# Patient Record
Sex: Female | Born: 1950
Health system: Southern US, Community
[De-identification: ages and names within clinical notes are randomized; demographics above are authoritative.]

## PROBLEM LIST (undated history)

## (undated) DIAGNOSIS — I10 Essential (primary) hypertension: Secondary | ICD-10-CM

## (undated) DIAGNOSIS — E7800 Pure hypercholesterolemia, unspecified: Secondary | ICD-10-CM

## (undated) DIAGNOSIS — E079 Disorder of thyroid, unspecified: Secondary | ICD-10-CM

---

## 2013-05-07 ENCOUNTER — Other Ambulatory Visit: Payer: Self-pay | Admitting: Family Medicine

## 2013-05-07 DIAGNOSIS — Z1231 Encounter for screening mammogram for malignant neoplasm of breast: Secondary | ICD-10-CM

## 2013-05-07 DIAGNOSIS — E2839 Other primary ovarian failure: Secondary | ICD-10-CM

## 2013-08-12 ENCOUNTER — Ambulatory Visit
Admission: RE | Admit: 2013-08-12 | Discharge: 2013-08-12 | Disposition: A | Discharge status: Home / Self Care | Payer: Medicare Other | Source: Ambulatory Visit | Attending: Family Medicine | Admitting: Family Medicine

## 2013-08-12 DIAGNOSIS — Z1231 Encounter for screening mammogram for malignant neoplasm of breast: Secondary | ICD-10-CM

## 2013-08-12 DIAGNOSIS — E2839 Other primary ovarian failure: Secondary | ICD-10-CM

## 2013-09-21 ENCOUNTER — Encounter (HOSPITAL_COMMUNITY): Payer: Self-pay | Admitting: Emergency Medicine

## 2013-09-21 ENCOUNTER — Emergency Department (HOSPITAL_COMMUNITY)
Admission: EM | Admit: 2013-09-21 | Discharge: 2013-09-21 | Disposition: A | Discharge status: Home / Self Care | Payer: Medicare Other | Attending: Emergency Medicine | Admitting: Emergency Medicine

## 2013-09-21 DIAGNOSIS — Z77098 Contact with and (suspected) exposure to other hazardous, chiefly nonmedicinal, chemicals: Secondary | ICD-10-CM

## 2013-09-21 DIAGNOSIS — H0289 Other specified disorders of eyelid: Secondary | ICD-10-CM | POA: Insufficient documentation

## 2013-09-21 DIAGNOSIS — Z7729 Contact with and (suspected ) exposure to other hazardous substances: Secondary | ICD-10-CM | POA: Insufficient documentation

## 2013-09-21 DIAGNOSIS — E7800 Pure hypercholesterolemia, unspecified: Secondary | ICD-10-CM | POA: Insufficient documentation

## 2013-09-21 DIAGNOSIS — L988 Other specified disorders of the skin and subcutaneous tissue: Secondary | ICD-10-CM | POA: Insufficient documentation

## 2013-09-21 DIAGNOSIS — E079 Disorder of thyroid, unspecified: Secondary | ICD-10-CM | POA: Insufficient documentation

## 2013-09-21 DIAGNOSIS — I10 Essential (primary) hypertension: Secondary | ICD-10-CM | POA: Insufficient documentation

## 2013-09-21 DIAGNOSIS — Z79899 Other long term (current) drug therapy: Secondary | ICD-10-CM | POA: Insufficient documentation

## 2013-09-21 HISTORY — DX: Pure hypercholesterolemia, unspecified: E78.00

## 2013-09-21 HISTORY — DX: Disorder of thyroid, unspecified: E07.9

## 2013-09-21 HISTORY — DX: Essential (primary) hypertension: I10

## 2013-09-21 MED ORDER — TETRACAINE HCL 0.5 % OP SOLN
2.0000 [drp] | Freq: Once | OPHTHALMIC | Status: AC
Start: 2013-09-21 — End: 2013-09-21
  Administered 2013-09-21: 2 [drp] via OPHTHALMIC
  Filled 2013-09-21: qty 2

## 2013-09-21 MED ORDER — FLUORESCEIN SODIUM 1 MG OP STRP
1.0000 | ORAL_STRIP | Freq: Once | OPHTHALMIC | Status: AC
  Administered 2013-09-21: 1 via OPHTHALMIC
  Filled 2013-09-21: qty 1

## 2013-09-21 NOTE — ED Notes (Signed)
Hair dye bottle exploded, splashing into left eye. C/o continued burning. Charge nurse notified of need for eye wash station.

## 2013-09-21 NOTE — ED Provider Notes (Signed)
CSN: [id number]     Arrival date & time 09/21/13  1152 History   First MD Initiated Contact with Patient 09/21/13 1226     Chief Complaint  Patient presents with  . Eye Injury  . Chemical Exposure     (Consider location/radiation/quality/duration/timing/severity/associated sxs/prior Treatment) HPI Comments: Patient presents today with a chief complaint of skin irritation.  She reports that just prior to arrival a bottle with hair dye broke as she was putting the dye in her hair.  The hair dye then got on her face.  She is currently complaining of irritation to the left upper eyelid and left side of her face.  She denies ingesting any of the dye.  She denies any eye pain or vision changes.  Denies any redness or discharge of the eye.  She reports that she washed out her face and eyes with water after the exposure.  She denies any shortness of breath.    The history is provided by the patient.    Past Medical History  Diagnosis Date  . Hypertension   . Thyroid disease   . High cholesterol    History reviewed. No pertinent past surgical history. History reviewed. No pertinent family history. History  Substance Use Topics  . Smoking status: Not on file  . Smokeless tobacco: Not on file  . Alcohol Use: No   OB History   Grav Para Term Preterm Abortions TAB SAB Ect Mult Living                 Review of Systems  All other systems reviewed and are negative.     Allergies  Aspirin  Home Medications   Prior to Admission medications   Medication Sig Start Date End Date Taking? Authorizing Provider  levothyroxine (SYNTHROID, LEVOTHROID) 112 MCG tablet Take 112 mcg by mouth daily before breakfast.   Yes Historical Provider, MD  simvastatin (ZOCOR) 5 MG tablet Take 5 mg by mouth daily.   Yes Historical Provider, MD   BP 148/96  Pulse 65  Temp(Src) 99.1 F (37.3 C) (Oral)  Resp 20  SpO2 99% Physical Exam  Nursing note and vitals reviewed. Constitutional: She appears  well-developed and well-nourished.  HENT:  Head: Normocephalic and atraumatic.  Eyes: EOM are normal. Pupils are equal, round, and reactive to light. Right conjunctiva is not injected. Left conjunctiva is not injected.  Slit lamp exam:      The left eye shows no corneal abrasion, no corneal ulcer, no foreign body and no fluorescein uptake.  Visual acuity 20/70 bilaterally  Neck: Normal range of motion. Neck supple.  Cardiovascular: Normal rate, regular rhythm and normal heart sounds.   Pulmonary/Chest: Effort normal and breath sounds normal.  Neurological: She is alert.  Skin: Skin is warm and dry.  No obvious erythema, blistering, or irritation of the skin  Psychiatric: She has a normal mood and affect.    ED Course  Procedures (including critical care time) Labs Review Labs Reviewed - No data to display  Imaging Review No results found.   EKG Interpretation None      MDM   Final diagnoses:  None   Patient presenting with skin irritation after spilling hair dye on her face.  No ingestion of the dye.  No changes in vision.  Eyes are not injected.  No signs of skin injury on exam.  Fluorescein stain is negative.   Patient stable for discharge.    Hyman Bible, PA-C 09/21/13 1547

## 2013-09-21 NOTE — ED Notes (Signed)
Onset 11am pt was sqeezing bottle of hair dye cracked and hair dye got on face.  Burning to left upper eyelid.  Eyes not burning, no visual disturbances.  No other s/s noted.

## 2013-09-21 NOTE — ED Notes (Signed)
Pt reports having a bottle of hair dye explode while in her hand, and it went in her face and eyes. Pt reports rinsing her eyes out pta, having burning pain to left eye, denies any vision loss.

## 2013-09-21 NOTE — Discharge Instructions (Signed)
Flush eyes with water.  Wash off skin with cool water.  May apply ice to the skin if it is burning.  Return to the ER if you develop changes in your vision.

## 2013-09-21 NOTE — ED Notes (Signed)
The patient does wear no line bifocals. The tech has reported to the RN in charge.

## 2013-09-22 NOTE — ED Provider Notes (Signed)
Medical screening examination/treatment/procedure(s) were performed by non-physician practitioner and as supervising physician I was immediately available for consultation/collaboration.   Dot Lanes, MD 09/22/13 [phone]

## 2014-01-21 ENCOUNTER — Other Ambulatory Visit: Payer: Self-pay | Admitting: Family Medicine

## 2014-01-24 ENCOUNTER — Other Ambulatory Visit: Payer: Self-pay | Admitting: Family Medicine

## 2014-01-24 DIAGNOSIS — Z720 Tobacco use: Secondary | ICD-10-CM

## 2014-02-07 ENCOUNTER — Other Ambulatory Visit: Payer: Self-pay | Admitting: Family Medicine

## 2014-02-07 ENCOUNTER — Ambulatory Visit
Admission: RE | Admit: 2014-02-07 | Discharge: 2014-02-07 | Disposition: A | Discharge status: Home / Self Care | Payer: Medicare Other | Source: Ambulatory Visit | Attending: Family Medicine | Admitting: Family Medicine

## 2014-02-07 DIAGNOSIS — Z720 Tobacco use: Secondary | ICD-10-CM

## 2014-09-22 ENCOUNTER — Other Ambulatory Visit: Payer: Self-pay

## 2014-09-22 DIAGNOSIS — Z1231 Encounter for screening mammogram for malignant neoplasm of breast: Secondary | ICD-10-CM

## 2014-10-01 ENCOUNTER — Ambulatory Visit
Admission: RE | Admit: 2014-10-01 | Discharge: 2014-10-01 | Disposition: A | Discharge status: Home / Self Care | Payer: Medicare Other | Source: Ambulatory Visit

## 2014-10-01 DIAGNOSIS — Z1231 Encounter for screening mammogram for malignant neoplasm of breast: Secondary | ICD-10-CM

## 2015-01-09 ENCOUNTER — Other Ambulatory Visit: Payer: Self-pay | Admitting: Family Medicine

## 2015-01-09 ENCOUNTER — Other Ambulatory Visit (HOSPITAL_COMMUNITY)
Admission: RE | Admit: 2015-01-09 | Discharge: 2015-01-09 | Disposition: A | Discharge status: Home / Self Care | Payer: Medicare Other | Source: Ambulatory Visit | Attending: Family Medicine | Admitting: Family Medicine

## 2015-01-09 DIAGNOSIS — Z124 Encounter for screening for malignant neoplasm of cervix: Secondary | ICD-10-CM | POA: Insufficient documentation

## 2015-01-09 DIAGNOSIS — Z87898 Personal history of other specified conditions: Secondary | ICD-10-CM | POA: Diagnosis present

## 2015-01-13 LAB — CYTOLOGY - PAP

## 2015-02-11 ENCOUNTER — Ambulatory Visit: Payer: Self-pay | Admitting: Gynecology

## 2015-03-09 ENCOUNTER — Ambulatory Visit: Payer: Medicare Other | Admitting: Gynecology

## 2015-06-09 DIAGNOSIS — G5600 Carpal tunnel syndrome, unspecified upper limb: Secondary | ICD-10-CM | POA: Diagnosis not present

## 2015-06-09 DIAGNOSIS — E1169 Type 2 diabetes mellitus with other specified complication: Secondary | ICD-10-CM | POA: Diagnosis not present

## 2015-06-09 DIAGNOSIS — E119 Type 2 diabetes mellitus without complications: Secondary | ICD-10-CM | POA: Diagnosis not present

## 2015-06-09 DIAGNOSIS — Z79899 Other long term (current) drug therapy: Secondary | ICD-10-CM | POA: Diagnosis not present

## 2015-06-09 DIAGNOSIS — I10 Essential (primary) hypertension: Secondary | ICD-10-CM | POA: Diagnosis not present

## 2015-06-09 DIAGNOSIS — Z7984 Long term (current) use of oral hypoglycemic drugs: Secondary | ICD-10-CM | POA: Diagnosis not present

## 2015-06-09 DIAGNOSIS — E039 Hypothyroidism, unspecified: Secondary | ICD-10-CM | POA: Diagnosis not present

## 2015-06-10 DIAGNOSIS — H18413 Arcus senilis, bilateral: Secondary | ICD-10-CM | POA: Diagnosis not present

## 2015-06-10 DIAGNOSIS — H04123 Dry eye syndrome of bilateral lacrimal glands: Secondary | ICD-10-CM | POA: Diagnosis not present

## 2015-06-10 DIAGNOSIS — H11153 Pinguecula, bilateral: Secondary | ICD-10-CM | POA: Diagnosis not present

## 2015-06-10 DIAGNOSIS — H04213 Epiphora due to excess lacrimation, bilateral lacrimal glands: Secondary | ICD-10-CM | POA: Diagnosis not present

## 2015-06-10 DIAGNOSIS — Z961 Presence of intraocular lens: Secondary | ICD-10-CM | POA: Diagnosis not present

## 2015-06-10 DIAGNOSIS — S0500XA Injury of conjunctiva and corneal abrasion without foreign body, unspecified eye, initial encounter: Secondary | ICD-10-CM | POA: Diagnosis not present

## 2015-06-10 DIAGNOSIS — Z9849 Cataract extraction status, unspecified eye: Secondary | ICD-10-CM | POA: Diagnosis not present

## 2015-06-15 DIAGNOSIS — E1169 Type 2 diabetes mellitus with other specified complication: Secondary | ICD-10-CM | POA: Diagnosis not present

## 2015-06-15 DIAGNOSIS — I10 Essential (primary) hypertension: Secondary | ICD-10-CM | POA: Diagnosis not present

## 2015-06-15 DIAGNOSIS — E119 Type 2 diabetes mellitus without complications: Secondary | ICD-10-CM | POA: Diagnosis not present

## 2015-06-15 DIAGNOSIS — Z79899 Other long term (current) drug therapy: Secondary | ICD-10-CM | POA: Diagnosis not present

## 2015-06-15 DIAGNOSIS — E039 Hypothyroidism, unspecified: Secondary | ICD-10-CM | POA: Diagnosis not present

## 2015-06-15 DIAGNOSIS — R3129 Other microscopic hematuria: Secondary | ICD-10-CM | POA: Diagnosis not present

## 2015-08-21 ENCOUNTER — Other Ambulatory Visit: Payer: Self-pay

## 2015-08-21 DIAGNOSIS — Z1231 Encounter for screening mammogram for malignant neoplasm of breast: Secondary | ICD-10-CM

## 2015-10-02 ENCOUNTER — Other Ambulatory Visit: Payer: Self-pay | Admitting: Family Medicine

## 2015-10-02 ENCOUNTER — Ambulatory Visit
Admission: RE | Admit: 2015-10-02 | Discharge: 2015-10-02 | Disposition: A | Discharge status: Home / Self Care | Payer: Commercial Managed Care - HMO | Source: Ambulatory Visit

## 2015-10-02 DIAGNOSIS — Z1231 Encounter for screening mammogram for malignant neoplasm of breast: Secondary | ICD-10-CM | POA: Diagnosis not present

## 2015-10-07 ENCOUNTER — Other Ambulatory Visit: Payer: Self-pay | Admitting: Family Medicine

## 2015-10-07 DIAGNOSIS — R928 Other abnormal and inconclusive findings on diagnostic imaging of breast: Secondary | ICD-10-CM

## 2015-10-13 ENCOUNTER — Ambulatory Visit
Admission: RE | Admit: 2015-10-13 | Discharge: 2015-10-13 | Disposition: A | Discharge status: Home / Self Care | Payer: Commercial Managed Care - HMO | Source: Ambulatory Visit | Attending: Family Medicine | Admitting: Family Medicine

## 2015-10-13 DIAGNOSIS — R928 Other abnormal and inconclusive findings on diagnostic imaging of breast: Secondary | ICD-10-CM

## 2015-10-13 DIAGNOSIS — R922 Inconclusive mammogram: Secondary | ICD-10-CM | POA: Diagnosis not present

## 2016-03-29 DIAGNOSIS — I10 Essential (primary) hypertension: Secondary | ICD-10-CM | POA: Diagnosis not present

## 2016-04-26 DIAGNOSIS — H9319 Tinnitus, unspecified ear: Secondary | ICD-10-CM | POA: Diagnosis not present

## 2016-05-09 DIAGNOSIS — Z79899 Other long term (current) drug therapy: Secondary | ICD-10-CM | POA: Diagnosis not present

## 2016-05-09 DIAGNOSIS — Z7984 Long term (current) use of oral hypoglycemic drugs: Secondary | ICD-10-CM | POA: Diagnosis not present

## 2016-05-09 DIAGNOSIS — E785 Hyperlipidemia, unspecified: Secondary | ICD-10-CM | POA: Diagnosis not present

## 2016-05-09 DIAGNOSIS — E119 Type 2 diabetes mellitus without complications: Secondary | ICD-10-CM | POA: Diagnosis not present

## 2016-07-04 DIAGNOSIS — K6389 Other specified diseases of intestine: Secondary | ICD-10-CM | POA: Diagnosis not present

## 2016-07-04 DIAGNOSIS — D126 Benign neoplasm of colon, unspecified: Secondary | ICD-10-CM | POA: Diagnosis not present

## 2016-07-04 DIAGNOSIS — K621 Rectal polyp: Secondary | ICD-10-CM | POA: Diagnosis not present

## 2016-07-04 DIAGNOSIS — Z1211 Encounter for screening for malignant neoplasm of colon: Secondary | ICD-10-CM | POA: Diagnosis not present

## 2016-07-04 DIAGNOSIS — K648 Other hemorrhoids: Secondary | ICD-10-CM | POA: Diagnosis not present

## 2016-07-07 DIAGNOSIS — D126 Benign neoplasm of colon, unspecified: Secondary | ICD-10-CM | POA: Diagnosis not present

## 2016-07-07 DIAGNOSIS — Z1211 Encounter for screening for malignant neoplasm of colon: Secondary | ICD-10-CM | POA: Diagnosis not present

## 2016-07-07 DIAGNOSIS — K621 Rectal polyp: Secondary | ICD-10-CM | POA: Diagnosis not present

## 2016-08-09 DIAGNOSIS — G5600 Carpal tunnel syndrome, unspecified upper limb: Secondary | ICD-10-CM | POA: Diagnosis not present

## 2016-08-09 DIAGNOSIS — Z7984 Long term (current) use of oral hypoglycemic drugs: Secondary | ICD-10-CM | POA: Diagnosis not present

## 2016-08-09 DIAGNOSIS — I10 Essential (primary) hypertension: Secondary | ICD-10-CM | POA: Diagnosis not present

## 2016-08-09 DIAGNOSIS — E7800 Pure hypercholesterolemia, unspecified: Secondary | ICD-10-CM | POA: Diagnosis not present

## 2016-08-09 DIAGNOSIS — E039 Hypothyroidism, unspecified: Secondary | ICD-10-CM | POA: Diagnosis not present

## 2016-08-09 DIAGNOSIS — E119 Type 2 diabetes mellitus without complications: Secondary | ICD-10-CM | POA: Diagnosis not present

## 2016-09-02 ENCOUNTER — Other Ambulatory Visit: Payer: Self-pay | Admitting: Family Medicine

## 2016-09-02 DIAGNOSIS — Z1231 Encounter for screening mammogram for malignant neoplasm of breast: Secondary | ICD-10-CM

## 2016-09-14 ENCOUNTER — Encounter: Payer: Self-pay | Admitting: Gynecology

## 2016-10-17 ENCOUNTER — Ambulatory Visit: Payer: Commercial Managed Care - HMO

## 2016-10-17 DIAGNOSIS — Z7984 Long term (current) use of oral hypoglycemic drugs: Secondary | ICD-10-CM | POA: Diagnosis not present

## 2016-10-17 DIAGNOSIS — Z1389 Encounter for screening for other disorder: Secondary | ICD-10-CM | POA: Diagnosis not present

## 2016-10-17 DIAGNOSIS — E039 Hypothyroidism, unspecified: Secondary | ICD-10-CM | POA: Diagnosis not present

## 2016-10-17 DIAGNOSIS — Z1159 Encounter for screening for other viral diseases: Secondary | ICD-10-CM | POA: Diagnosis not present

## 2016-10-17 DIAGNOSIS — E7800 Pure hypercholesterolemia, unspecified: Secondary | ICD-10-CM | POA: Diagnosis not present

## 2016-10-17 DIAGNOSIS — E119 Type 2 diabetes mellitus without complications: Secondary | ICD-10-CM | POA: Diagnosis not present

## 2016-10-17 DIAGNOSIS — Z0000 Encounter for general adult medical examination without abnormal findings: Secondary | ICD-10-CM | POA: Diagnosis not present

## 2016-10-17 DIAGNOSIS — I10 Essential (primary) hypertension: Secondary | ICD-10-CM | POA: Diagnosis not present

## 2016-10-20 ENCOUNTER — Ambulatory Visit
Admission: RE | Admit: 2016-10-20 | Discharge: 2016-10-20 | Disposition: A | Discharge status: Home / Self Care | Payer: Commercial Managed Care - HMO | Source: Ambulatory Visit | Attending: Physician Assistant | Admitting: Physician Assistant

## 2016-10-20 DIAGNOSIS — Z1231 Encounter for screening mammogram for malignant neoplasm of breast: Secondary | ICD-10-CM | POA: Diagnosis not present

## 2016-10-25 DIAGNOSIS — Z961 Presence of intraocular lens: Secondary | ICD-10-CM | POA: Diagnosis not present

## 2016-10-25 DIAGNOSIS — H52221 Regular astigmatism, right eye: Secondary | ICD-10-CM | POA: Diagnosis not present

## 2016-10-25 DIAGNOSIS — H11423 Conjunctival edema, bilateral: Secondary | ICD-10-CM | POA: Diagnosis not present

## 2016-10-25 DIAGNOSIS — Z9849 Cataract extraction status, unspecified eye: Secondary | ICD-10-CM | POA: Diagnosis not present

## 2016-10-25 DIAGNOSIS — H35363 Drusen (degenerative) of macula, bilateral: Secondary | ICD-10-CM | POA: Diagnosis not present

## 2016-10-25 DIAGNOSIS — H5213 Myopia, bilateral: Secondary | ICD-10-CM | POA: Diagnosis not present

## 2016-10-25 DIAGNOSIS — H18413 Arcus senilis, bilateral: Secondary | ICD-10-CM | POA: Diagnosis not present

## 2016-10-25 DIAGNOSIS — H11153 Pinguecula, bilateral: Secondary | ICD-10-CM | POA: Diagnosis not present

## 2016-10-25 DIAGNOSIS — H353131 Nonexudative age-related macular degeneration, bilateral, early dry stage: Secondary | ICD-10-CM | POA: Diagnosis not present

## 2016-10-31 DIAGNOSIS — J209 Acute bronchitis, unspecified: Secondary | ICD-10-CM | POA: Diagnosis not present

## 2016-12-19 DIAGNOSIS — E039 Hypothyroidism, unspecified: Secondary | ICD-10-CM | POA: Diagnosis not present

## 2017-04-11 DIAGNOSIS — M25461 Effusion, right knee: Secondary | ICD-10-CM | POA: Diagnosis not present

## 2017-04-11 DIAGNOSIS — S8991XA Unspecified injury of right lower leg, initial encounter: Secondary | ICD-10-CM | POA: Diagnosis not present

## 2017-04-14 DIAGNOSIS — W19XXXA Unspecified fall, initial encounter: Secondary | ICD-10-CM | POA: Diagnosis not present

## 2017-04-14 DIAGNOSIS — S8991XA Unspecified injury of right lower leg, initial encounter: Secondary | ICD-10-CM | POA: Diagnosis not present

## 2017-04-18 DIAGNOSIS — E7800 Pure hypercholesterolemia, unspecified: Secondary | ICD-10-CM | POA: Diagnosis not present

## 2017-04-18 DIAGNOSIS — E039 Hypothyroidism, unspecified: Secondary | ICD-10-CM | POA: Diagnosis not present

## 2017-04-18 DIAGNOSIS — I10 Essential (primary) hypertension: Secondary | ICD-10-CM | POA: Diagnosis not present

## 2017-04-18 DIAGNOSIS — Z23 Encounter for immunization: Secondary | ICD-10-CM | POA: Diagnosis not present

## 2017-04-18 DIAGNOSIS — Z7984 Long term (current) use of oral hypoglycemic drugs: Secondary | ICD-10-CM | POA: Diagnosis not present

## 2017-04-18 DIAGNOSIS — E119 Type 2 diabetes mellitus without complications: Secondary | ICD-10-CM | POA: Diagnosis not present

## 2017-04-18 DIAGNOSIS — E1169 Type 2 diabetes mellitus with other specified complication: Secondary | ICD-10-CM | POA: Diagnosis not present

## 2017-09-11 ENCOUNTER — Other Ambulatory Visit: Payer: Self-pay | Admitting: Internal Medicine

## 2017-09-11 DIAGNOSIS — Z1231 Encounter for screening mammogram for malignant neoplasm of breast: Secondary | ICD-10-CM

## 2017-10-14 ENCOUNTER — Other Ambulatory Visit: Payer: Self-pay

## 2017-10-14 ENCOUNTER — Emergency Department (HOSPITAL_COMMUNITY)
Admission: EM | Admit: 2017-10-14 | Discharge: 2017-10-14 | Disposition: A | Discharge status: Home / Self Care | Payer: Medicare Other | Attending: Emergency Medicine | Admitting: Emergency Medicine

## 2017-10-14 ENCOUNTER — Encounter (HOSPITAL_COMMUNITY): Payer: Self-pay | Admitting: Emergency Medicine

## 2017-10-14 DIAGNOSIS — S6991XA Unspecified injury of right wrist, hand and finger(s), initial encounter: Secondary | ICD-10-CM | POA: Diagnosis present

## 2017-10-14 DIAGNOSIS — Y939 Activity, unspecified: Secondary | ICD-10-CM | POA: Diagnosis not present

## 2017-10-14 DIAGNOSIS — W5501XA Bitten by cat, initial encounter: Secondary | ICD-10-CM | POA: Insufficient documentation

## 2017-10-14 DIAGNOSIS — E079 Disorder of thyroid, unspecified: Secondary | ICD-10-CM | POA: Insufficient documentation

## 2017-10-14 DIAGNOSIS — Z79899 Other long term (current) drug therapy: Secondary | ICD-10-CM | POA: Diagnosis not present

## 2017-10-14 DIAGNOSIS — S61451A Open bite of right hand, initial encounter: Secondary | ICD-10-CM | POA: Diagnosis not present

## 2017-10-14 DIAGNOSIS — I10 Essential (primary) hypertension: Secondary | ICD-10-CM | POA: Diagnosis not present

## 2017-10-14 DIAGNOSIS — Y929 Unspecified place or not applicable: Secondary | ICD-10-CM | POA: Insufficient documentation

## 2017-10-14 DIAGNOSIS — Y999 Unspecified external cause status: Secondary | ICD-10-CM | POA: Diagnosis not present

## 2017-10-14 MED ORDER — IBUPROFEN 400 MG PO TABS
600.0000 mg | ORAL_TABLET | Freq: Once | ORAL | Status: AC
  Administered 2017-10-14: 600 mg via ORAL
  Filled 2017-10-14: qty 1

## 2017-10-14 MED ORDER — AMOXICILLIN-POT CLAVULANATE 875-125 MG PO TABS: 1.0000 | ORAL_TABLET | Freq: Two times a day (BID) | ORAL | 0 refills | Status: AC

## 2017-10-14 NOTE — Discharge Instructions (Addendum)
Take antibiotics as prescribed.  Take the entire course, even if your symptoms improve. Use ibuprofen as needed for pain and swelling. Use ice to help with pain and swelling. Wash the cut daily with soap and water.  Apply dressing each day. Follow-up with the hand doctor on Monday or Tuesday for further evaluation of your hand. Return to the emergency room if you develop high fevers, persistent bleeding, pus draining from the cut, swelling extending up your arm, or any new or concerning symptoms.

## 2017-10-14 NOTE — ED Triage Notes (Signed)
Pt presents to the ED having been bitten by a stray cat on her right hand this afternoon. Cleaned with peroxide at the time but not has begun to swell and is more painful.

## 2017-10-14 NOTE — ED Provider Notes (Signed)
[facility] Provider Note   CSN: [id number] Arrival date & time: 10/14/17  2106     History   Chief Complaint Chief Complaint  Patient presents with  . Animal Bite    HPI Christine Dodson is a 67 y.o. female presenting for evaluation of hand pain after cat bite.  Patient states that around 3:00 this afternoon, she was scratched or bitten by a cat.  It broke the skin of her right dorsal hand.  She reports that since then, she has had worsening swelling and pain.  She has had minimal bleeding from the area.  She denies pain extending into her arm.  She has a history of diabetes for which she takes medication, is otherwise not immunocompromised.  She is not allergic to any antibiotics.  Her tetanus is up-to-date.  The cat has been quarantined.  She denies difficulty moving her wrist or her fingers.  She has not taken anything for pain including Tylenol or ibuprofen.  Pain is worse with palpation, nothing makes it better.  No radiation of the pain.   HPI  Past Medical History:  Diagnosis Date  . High cholesterol   . Hypertension   . Thyroid disease     There are no active problems to display for this patient.   History reviewed. No pertinent surgical history.   OB History   None      Home Medications    Prior to Admission medications   Medication Sig Start Date End Date Taking? Authorizing Provider  amLODipine (NORVASC) 10 MG tablet Take 10 mg by mouth daily.    [provider]  amoxicillin-clavulanate (AUGMENTIN) 875-125 MG tablet Take 1 tablet by mouth every 12 (twelve) hours. 10/14/17   Marliyah Reid, PA-C  ergocalciferol (VITAMIN D2) 50000 UNITS capsule Take 50,000 Units by mouth 2 (two) times a week. Saturday and Thursday    [provider]  levothyroxine (SYNTHROID, LEVOTHROID) 112 MCG tablet Take 112 mcg by mouth daily before breakfast.    [provider]  Multiple Vitamins-Minerals (PRESERVISION  AREDS PO) Take 1 tablet by mouth 2 (two) times daily.    [provider]  Polyethyl Glycol-Propyl Glycol (SYSTANE) 0.4-0.3 % SOLN Apply 1 drop to eye every morning.    [provider]  pravastatin (PRAVACHOL) 40 MG tablet Take 40 mg by mouth daily.    [provider]  simvastatin (ZOCOR) 5 MG tablet Take 5 mg by mouth daily.    [provider]    Family History Family History  Problem Relation Age of Onset  . Breast cancer Neg Hx     Social History Social History   Tobacco Use  . Smoking status: Never Smoker  . Smokeless tobacco: Never Used  Substance Use Topics  . Alcohol use: No  . Drug use: No     Allergies   Aspirin   Review of Systems Review of Systems  Skin: Positive for wound.  Allergic/Immunologic: Negative for immunocompromised state.  Hematological: Does not bruise/bleed easily.     Physical Exam Updated Vital Signs BP (!) 166/79 (BP Location: Right Arm)   Pulse (!) 55   Temp 98.1 F (36.7 C) (Oral)   Resp 18   SpO2 100%   Physical Exam  Constitutional: She is oriented to person, place, and time. She appears well-developed and well-nourished. No distress.  HENT:  Head: Normocephalic and atraumatic.  Eyes: EOM are normal.  Neck: Normal range of motion.  Pulmonary/Chest: Effort normal.  Abdominal: She exhibits no distension.  Musculoskeletal: Normal range of motion. She exhibits edema and tenderness.  Full active range of motion of wrist and fingers.  Strength against resistance intact.  Radial pulses intact bilaterally. Noticed palpation of dorsal right hand with swelling.  Neurological: She is alert and oriented to person, place, and time. No sensory deficit.  Skin: Skin is warm. Capillary refill takes less than 2 seconds. No rash noted.  0.75 cm lac of dorsal right hand.  No injury noted elsewhere  Psychiatric: She has a normal mood and affect.  Nursing note and vitals reviewed.    ED Treatments / Results    Labs (all labs ordered are listed, but only abnormal results are displayed) Labs Reviewed - No data to display  EKG None  Radiology No results found.  Procedures Procedures (including critical care time)  Medications Ordered in ED Medications  ibuprofen (ADVIL,MOTRIN) tablet 600 mg (600 mg Oral Given 10/14/17 2212)     Initial Impression / Assessment and Plan / ED Course  I have reviewed the triage vital signs and the nursing notes.  Pertinent labs & imaging results that were available during my care of the patient were reviewed by me and considered in my medical decision making (see chart for details).     Patient presenting for evaluation of cat bite/scratch.  Physical exam shows patient who is neurovascularly intact.  Tenderness and swelling of the dorsal aspect of the right hand.  Laceration is not deep, no tendon involvement.  Will irrigate extensively and applied dressing.  Will start patient on antibiotics, ibuprofen for pain.  Cat has been quarantined, pt does not need rabies treatment at this time.  At this time, patient appears a for discharge.  Return precautions given.  Patient states he understands and agrees to plan.  Final Clinical Impressions(s) / ED Diagnoses   Final diagnoses:  Cat bite of right hand, initial encounter    ED Discharge Orders        Ordered    amoxicillin-clavulanate (AUGMENTIN) 875-125 MG tablet  Every 12 hours     10/14/17 2159       Franchot Heidelberg, PA-C 10/14/17 2330    Hayden Rasmussen, MD 10/16/17 1143

## 2017-10-20 DIAGNOSIS — Z23 Encounter for immunization: Secondary | ICD-10-CM | POA: Diagnosis not present

## 2017-10-20 DIAGNOSIS — M8588 Other specified disorders of bone density and structure, other site: Secondary | ICD-10-CM | POA: Diagnosis not present

## 2017-10-20 DIAGNOSIS — Z0000 Encounter for general adult medical examination without abnormal findings: Secondary | ICD-10-CM | POA: Diagnosis not present

## 2017-10-20 DIAGNOSIS — Z1389 Encounter for screening for other disorder: Secondary | ICD-10-CM | POA: Diagnosis not present

## 2017-10-20 DIAGNOSIS — I10 Essential (primary) hypertension: Secondary | ICD-10-CM | POA: Diagnosis not present

## 2017-10-20 DIAGNOSIS — E1169 Type 2 diabetes mellitus with other specified complication: Secondary | ICD-10-CM | POA: Diagnosis not present

## 2017-10-20 DIAGNOSIS — E7800 Pure hypercholesterolemia, unspecified: Secondary | ICD-10-CM | POA: Diagnosis not present

## 2017-10-20 DIAGNOSIS — E039 Hypothyroidism, unspecified: Secondary | ICD-10-CM | POA: Diagnosis not present

## 2017-10-25 ENCOUNTER — Ambulatory Visit
Admission: RE | Admit: 2017-10-25 | Discharge: 2017-10-25 | Disposition: A | Discharge status: Home / Self Care | Payer: Medicare Other | Source: Ambulatory Visit | Attending: Internal Medicine | Admitting: Internal Medicine

## 2017-10-25 ENCOUNTER — Ambulatory Visit: Payer: Commercial Managed Care - HMO

## 2017-10-25 DIAGNOSIS — Z1231 Encounter for screening mammogram for malignant neoplasm of breast: Secondary | ICD-10-CM | POA: Diagnosis not present

## 2017-11-29 DIAGNOSIS — Z7984 Long term (current) use of oral hypoglycemic drugs: Secondary | ICD-10-CM | POA: Diagnosis not present

## 2017-11-29 DIAGNOSIS — H353131 Nonexudative age-related macular degeneration, bilateral, early dry stage: Secondary | ICD-10-CM | POA: Diagnosis not present

## 2017-11-29 DIAGNOSIS — H04123 Dry eye syndrome of bilateral lacrimal glands: Secondary | ICD-10-CM | POA: Diagnosis not present

## 2017-11-29 DIAGNOSIS — H0100A Unspecified blepharitis right eye, upper and lower eyelids: Secondary | ICD-10-CM | POA: Diagnosis not present

## 2017-11-29 DIAGNOSIS — H18413 Arcus senilis, bilateral: Secondary | ICD-10-CM | POA: Diagnosis not present

## 2017-11-29 DIAGNOSIS — H11823 Conjunctivochalasis, bilateral: Secondary | ICD-10-CM | POA: Diagnosis not present

## 2017-11-29 DIAGNOSIS — H35363 Drusen (degenerative) of macula, bilateral: Secondary | ICD-10-CM | POA: Diagnosis not present

## 2017-11-29 DIAGNOSIS — H40013 Open angle with borderline findings, low risk, bilateral: Secondary | ICD-10-CM | POA: Diagnosis not present

## 2017-11-29 DIAGNOSIS — H0100B Unspecified blepharitis left eye, upper and lower eyelids: Secondary | ICD-10-CM | POA: Diagnosis not present

## 2017-11-29 DIAGNOSIS — S0502XA Injury of conjunctiva and corneal abrasion without foreign body, left eye, initial encounter: Secondary | ICD-10-CM | POA: Diagnosis not present

## 2017-11-29 DIAGNOSIS — H43811 Vitreous degeneration, right eye: Secondary | ICD-10-CM | POA: Diagnosis not present

## 2017-11-29 DIAGNOSIS — H11153 Pinguecula, bilateral: Secondary | ICD-10-CM | POA: Diagnosis not present

## 2017-11-30 DIAGNOSIS — M8588 Other specified disorders of bone density and structure, other site: Secondary | ICD-10-CM | POA: Diagnosis not present

## 2017-12-22 DIAGNOSIS — E039 Hypothyroidism, unspecified: Secondary | ICD-10-CM | POA: Diagnosis not present

## 2018-01-05 DIAGNOSIS — Z23 Encounter for immunization: Secondary | ICD-10-CM | POA: Diagnosis not present

## 2018-02-05 DIAGNOSIS — E039 Hypothyroidism, unspecified: Secondary | ICD-10-CM | POA: Diagnosis not present

## 2018-04-16 DIAGNOSIS — E1169 Type 2 diabetes mellitus with other specified complication: Secondary | ICD-10-CM | POA: Diagnosis not present

## 2018-04-16 DIAGNOSIS — E785 Hyperlipidemia, unspecified: Secondary | ICD-10-CM | POA: Diagnosis not present

## 2018-04-16 DIAGNOSIS — E039 Hypothyroidism, unspecified: Secondary | ICD-10-CM | POA: Diagnosis not present

## 2018-04-16 DIAGNOSIS — M8588 Other specified disorders of bone density and structure, other site: Secondary | ICD-10-CM | POA: Diagnosis not present

## 2018-04-16 DIAGNOSIS — Z7984 Long term (current) use of oral hypoglycemic drugs: Secondary | ICD-10-CM | POA: Diagnosis not present

## 2018-04-16 DIAGNOSIS — I10 Essential (primary) hypertension: Secondary | ICD-10-CM | POA: Diagnosis not present

## 2018-06-21 DIAGNOSIS — Z0100 Encounter for examination of eyes and vision without abnormal findings: Secondary | ICD-10-CM | POA: Diagnosis not present

## 2018-06-21 DIAGNOSIS — H40013 Open angle with borderline findings, low risk, bilateral: Secondary | ICD-10-CM | POA: Diagnosis not present

## 2018-07-09 DIAGNOSIS — H40023 Open angle with borderline findings, high risk, bilateral: Secondary | ICD-10-CM | POA: Diagnosis not present

## 2018-09-17 ENCOUNTER — Other Ambulatory Visit: Payer: Self-pay | Admitting: Internal Medicine

## 2018-09-17 DIAGNOSIS — Z1231 Encounter for screening mammogram for malignant neoplasm of breast: Secondary | ICD-10-CM

## 2018-11-09 DIAGNOSIS — E039 Hypothyroidism, unspecified: Secondary | ICD-10-CM | POA: Diagnosis not present

## 2018-11-09 DIAGNOSIS — I10 Essential (primary) hypertension: Secondary | ICD-10-CM | POA: Diagnosis not present

## 2018-11-09 DIAGNOSIS — Z0000 Encounter for general adult medical examination without abnormal findings: Secondary | ICD-10-CM | POA: Diagnosis not present

## 2018-11-09 DIAGNOSIS — E7800 Pure hypercholesterolemia, unspecified: Secondary | ICD-10-CM | POA: Diagnosis not present

## 2018-11-09 DIAGNOSIS — M8580 Other specified disorders of bone density and structure, unspecified site: Secondary | ICD-10-CM | POA: Diagnosis not present

## 2018-11-09 DIAGNOSIS — E1169 Type 2 diabetes mellitus with other specified complication: Secondary | ICD-10-CM | POA: Diagnosis not present

## 2018-11-09 DIAGNOSIS — Z1389 Encounter for screening for other disorder: Secondary | ICD-10-CM | POA: Diagnosis not present

## 2018-11-12 ENCOUNTER — Other Ambulatory Visit: Payer: Self-pay

## 2018-11-12 ENCOUNTER — Ambulatory Visit
Admission: RE | Admit: 2018-11-12 | Discharge: 2018-11-12 | Disposition: A | Discharge status: Home / Self Care | Payer: Medicare Other | Source: Ambulatory Visit | Attending: Internal Medicine | Admitting: Internal Medicine

## 2018-11-12 DIAGNOSIS — Z1231 Encounter for screening mammogram for malignant neoplasm of breast: Secondary | ICD-10-CM | POA: Diagnosis not present

## 2018-11-26 DIAGNOSIS — D120 Benign neoplasm of cecum: Secondary | ICD-10-CM | POA: Diagnosis not present

## 2018-11-26 DIAGNOSIS — Z86010 Personal history of colonic polyps: Secondary | ICD-10-CM | POA: Diagnosis not present

## 2018-11-26 DIAGNOSIS — Q438 Other specified congenital malformations of intestine: Secondary | ICD-10-CM | POA: Diagnosis not present

## 2018-11-26 DIAGNOSIS — K648 Other hemorrhoids: Secondary | ICD-10-CM | POA: Diagnosis not present

## 2018-11-28 DIAGNOSIS — D120 Benign neoplasm of cecum: Secondary | ICD-10-CM | POA: Diagnosis not present

## 2018-12-30 DIAGNOSIS — Z23 Encounter for immunization: Secondary | ICD-10-CM | POA: Diagnosis not present

## 2019-05-15 DIAGNOSIS — E7800 Pure hypercholesterolemia, unspecified: Secondary | ICD-10-CM | POA: Diagnosis not present

## 2019-05-15 DIAGNOSIS — E785 Hyperlipidemia, unspecified: Secondary | ICD-10-CM | POA: Diagnosis not present

## 2019-05-15 DIAGNOSIS — E1169 Type 2 diabetes mellitus with other specified complication: Secondary | ICD-10-CM | POA: Diagnosis not present

## 2019-05-15 DIAGNOSIS — I10 Essential (primary) hypertension: Secondary | ICD-10-CM | POA: Diagnosis not present

## 2019-05-15 DIAGNOSIS — E039 Hypothyroidism, unspecified: Secondary | ICD-10-CM | POA: Diagnosis not present

## 2019-10-07 ENCOUNTER — Other Ambulatory Visit: Payer: Self-pay | Admitting: Internal Medicine

## 2019-10-07 DIAGNOSIS — Z1231 Encounter for screening mammogram for malignant neoplasm of breast: Secondary | ICD-10-CM

## 2019-11-19 ENCOUNTER — Other Ambulatory Visit: Payer: Self-pay

## 2019-11-19 ENCOUNTER — Ambulatory Visit
Admission: RE | Admit: 2019-11-19 | Discharge: 2019-11-19 | Disposition: A | Discharge status: Home / Self Care | Payer: Medicare HMO | Source: Ambulatory Visit | Attending: Internal Medicine | Admitting: Internal Medicine

## 2019-11-19 DIAGNOSIS — Z1231 Encounter for screening mammogram for malignant neoplasm of breast: Secondary | ICD-10-CM | POA: Diagnosis not present

## 2019-11-20 DIAGNOSIS — E039 Hypothyroidism, unspecified: Secondary | ICD-10-CM | POA: Diagnosis not present

## 2019-11-20 DIAGNOSIS — Z0000 Encounter for general adult medical examination without abnormal findings: Secondary | ICD-10-CM | POA: Diagnosis not present

## 2019-11-20 DIAGNOSIS — E119 Type 2 diabetes mellitus without complications: Secondary | ICD-10-CM | POA: Diagnosis not present

## 2019-11-20 DIAGNOSIS — I10 Essential (primary) hypertension: Secondary | ICD-10-CM | POA: Diagnosis not present

## 2019-11-20 DIAGNOSIS — E1169 Type 2 diabetes mellitus with other specified complication: Secondary | ICD-10-CM | POA: Diagnosis not present

## 2019-11-20 DIAGNOSIS — Z7984 Long term (current) use of oral hypoglycemic drugs: Secondary | ICD-10-CM | POA: Diagnosis not present

## 2019-11-20 DIAGNOSIS — Z1389 Encounter for screening for other disorder: Secondary | ICD-10-CM | POA: Diagnosis not present

## 2019-11-20 DIAGNOSIS — M8580 Other specified disorders of bone density and structure, unspecified site: Secondary | ICD-10-CM | POA: Diagnosis not present

## 2019-11-20 DIAGNOSIS — E785 Hyperlipidemia, unspecified: Secondary | ICD-10-CM | POA: Diagnosis not present

## 2020-05-28 DIAGNOSIS — M549 Dorsalgia, unspecified: Secondary | ICD-10-CM | POA: Diagnosis not present

## 2020-05-28 DIAGNOSIS — E785 Hyperlipidemia, unspecified: Secondary | ICD-10-CM | POA: Diagnosis not present

## 2020-05-28 DIAGNOSIS — E1169 Type 2 diabetes mellitus with other specified complication: Secondary | ICD-10-CM | POA: Diagnosis not present

## 2020-05-28 DIAGNOSIS — E039 Hypothyroidism, unspecified: Secondary | ICD-10-CM | POA: Diagnosis not present

## 2020-05-28 DIAGNOSIS — I10 Essential (primary) hypertension: Secondary | ICD-10-CM | POA: Diagnosis not present

## 2020-07-21 DIAGNOSIS — E119 Type 2 diabetes mellitus without complications: Secondary | ICD-10-CM | POA: Diagnosis not present

## 2020-08-27 DIAGNOSIS — E039 Hypothyroidism, unspecified: Secondary | ICD-10-CM | POA: Diagnosis not present

## 2020-08-27 DIAGNOSIS — E1169 Type 2 diabetes mellitus with other specified complication: Secondary | ICD-10-CM | POA: Diagnosis not present

## 2020-08-27 DIAGNOSIS — M8588 Other specified disorders of bone density and structure, other site: Secondary | ICD-10-CM | POA: Diagnosis not present

## 2020-10-14 ENCOUNTER — Other Ambulatory Visit: Payer: Self-pay | Admitting: Internal Medicine

## 2020-10-14 DIAGNOSIS — Z1231 Encounter for screening mammogram for malignant neoplasm of breast: Secondary | ICD-10-CM

## 2020-11-23 DIAGNOSIS — I10 Essential (primary) hypertension: Secondary | ICD-10-CM | POA: Diagnosis not present

## 2020-11-23 DIAGNOSIS — M8588 Other specified disorders of bone density and structure, other site: Secondary | ICD-10-CM | POA: Diagnosis not present

## 2020-11-23 DIAGNOSIS — E785 Hyperlipidemia, unspecified: Secondary | ICD-10-CM | POA: Diagnosis not present

## 2020-11-23 DIAGNOSIS — Z7984 Long term (current) use of oral hypoglycemic drugs: Secondary | ICD-10-CM | POA: Diagnosis not present

## 2020-11-23 DIAGNOSIS — E1169 Type 2 diabetes mellitus with other specified complication: Secondary | ICD-10-CM | POA: Diagnosis not present

## 2020-11-23 DIAGNOSIS — Z0000 Encounter for general adult medical examination without abnormal findings: Secondary | ICD-10-CM | POA: Diagnosis not present

## 2020-11-23 DIAGNOSIS — Z1389 Encounter for screening for other disorder: Secondary | ICD-10-CM | POA: Diagnosis not present

## 2020-11-23 DIAGNOSIS — E039 Hypothyroidism, unspecified: Secondary | ICD-10-CM | POA: Diagnosis not present

## 2020-12-08 ENCOUNTER — Other Ambulatory Visit: Payer: Self-pay

## 2020-12-08 ENCOUNTER — Ambulatory Visit
Admission: RE | Admit: 2020-12-08 | Discharge: 2020-12-08 | Disposition: A | Discharge status: Home / Self Care | Payer: Medicare Other | Source: Ambulatory Visit | Attending: Internal Medicine | Admitting: Internal Medicine

## 2020-12-08 DIAGNOSIS — Z1231 Encounter for screening mammogram for malignant neoplasm of breast: Secondary | ICD-10-CM

## 2020-12-28 DIAGNOSIS — E039 Hypothyroidism, unspecified: Secondary | ICD-10-CM | POA: Diagnosis not present

## 2021-02-09 DIAGNOSIS — E039 Hypothyroidism, unspecified: Secondary | ICD-10-CM | POA: Diagnosis not present

## 2021-03-16 DIAGNOSIS — Z961 Presence of intraocular lens: Secondary | ICD-10-CM | POA: Diagnosis not present

## 2021-03-16 DIAGNOSIS — H40023 Open angle with borderline findings, high risk, bilateral: Secondary | ICD-10-CM | POA: Diagnosis not present

## 2021-03-16 DIAGNOSIS — H01023 Squamous blepharitis right eye, unspecified eyelid: Secondary | ICD-10-CM | POA: Diagnosis not present

## 2021-03-16 DIAGNOSIS — H16223 Keratoconjunctivitis sicca, not specified as Sjogren's, bilateral: Secondary | ICD-10-CM | POA: Diagnosis not present

## 2021-03-16 DIAGNOSIS — H43811 Vitreous degeneration, right eye: Secondary | ICD-10-CM | POA: Diagnosis not present

## 2021-03-16 DIAGNOSIS — H47213 Primary optic atrophy, bilateral: Secondary | ICD-10-CM | POA: Diagnosis not present

## 2021-03-16 DIAGNOSIS — H11823 Conjunctivochalasis, bilateral: Secondary | ICD-10-CM | POA: Diagnosis not present

## 2021-03-16 DIAGNOSIS — E119 Type 2 diabetes mellitus without complications: Secondary | ICD-10-CM | POA: Diagnosis not present

## 2021-03-16 DIAGNOSIS — H01026 Squamous blepharitis left eye, unspecified eyelid: Secondary | ICD-10-CM | POA: Diagnosis not present

## 2021-04-15 DIAGNOSIS — E039 Hypothyroidism, unspecified: Secondary | ICD-10-CM | POA: Diagnosis not present

## 2021-04-15 DIAGNOSIS — M5412 Radiculopathy, cervical region: Secondary | ICD-10-CM | POA: Diagnosis not present

## 2021-04-15 DIAGNOSIS — R002 Palpitations: Secondary | ICD-10-CM | POA: Diagnosis not present

## 2021-05-26 DIAGNOSIS — E7800 Pure hypercholesterolemia, unspecified: Secondary | ICD-10-CM | POA: Diagnosis not present

## 2021-05-26 DIAGNOSIS — H579 Unspecified disorder of eye and adnexa: Secondary | ICD-10-CM | POA: Diagnosis not present

## 2021-05-26 DIAGNOSIS — E039 Hypothyroidism, unspecified: Secondary | ICD-10-CM | POA: Diagnosis not present

## 2021-05-26 DIAGNOSIS — E1169 Type 2 diabetes mellitus with other specified complication: Secondary | ICD-10-CM | POA: Diagnosis not present

## 2021-05-26 DIAGNOSIS — I10 Essential (primary) hypertension: Secondary | ICD-10-CM | POA: Diagnosis not present

## 2021-06-02 DEATH — deceased

## 2021-06-07 DIAGNOSIS — H47213 Primary optic atrophy, bilateral: Secondary | ICD-10-CM | POA: Diagnosis not present

## 2021-09-14 DIAGNOSIS — E1169 Type 2 diabetes mellitus with other specified complication: Secondary | ICD-10-CM | POA: Diagnosis not present

## 2021-09-14 DIAGNOSIS — I10 Essential (primary) hypertension: Secondary | ICD-10-CM | POA: Diagnosis not present

## 2021-09-14 DIAGNOSIS — E039 Hypothyroidism, unspecified: Secondary | ICD-10-CM | POA: Diagnosis not present

## 2021-09-14 DIAGNOSIS — E785 Hyperlipidemia, unspecified: Secondary | ICD-10-CM | POA: Diagnosis not present

## 2021-10-31 IMAGING — MG DIGITAL SCREENING BILAT W/ TOMO W/ CAD
8 series · 8 of 24 positions shown · non-contrast
Comparison: Previous exam(s).

CLINICAL DATA: Screening.

EXAM:
DIGITAL SCREENING BILATERAL MAMMOGRAM WITH TOMO AND CAD

[L MLO synth-2D]
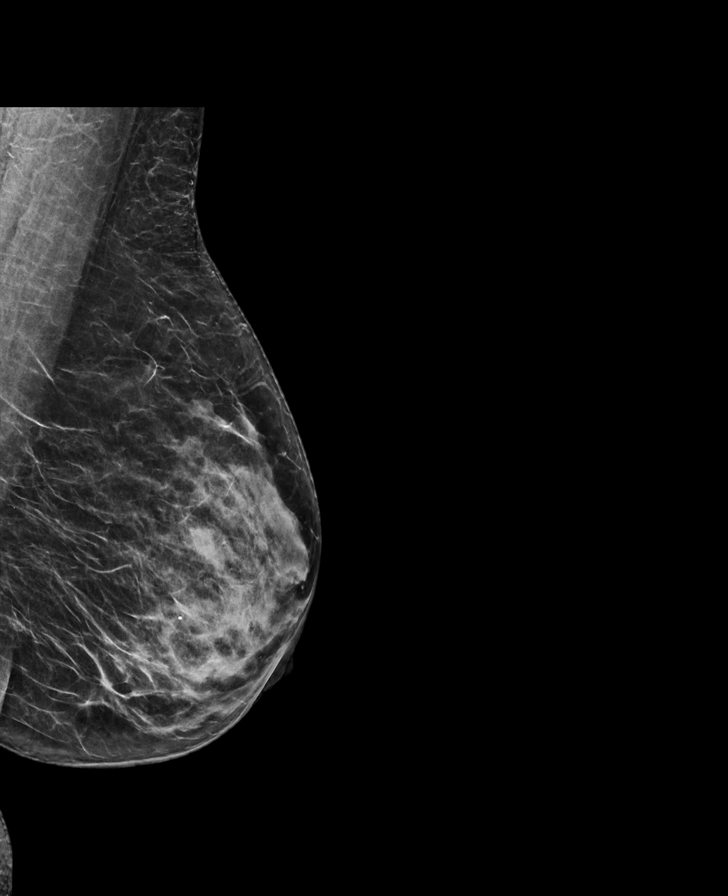

[R CC synth-2D]
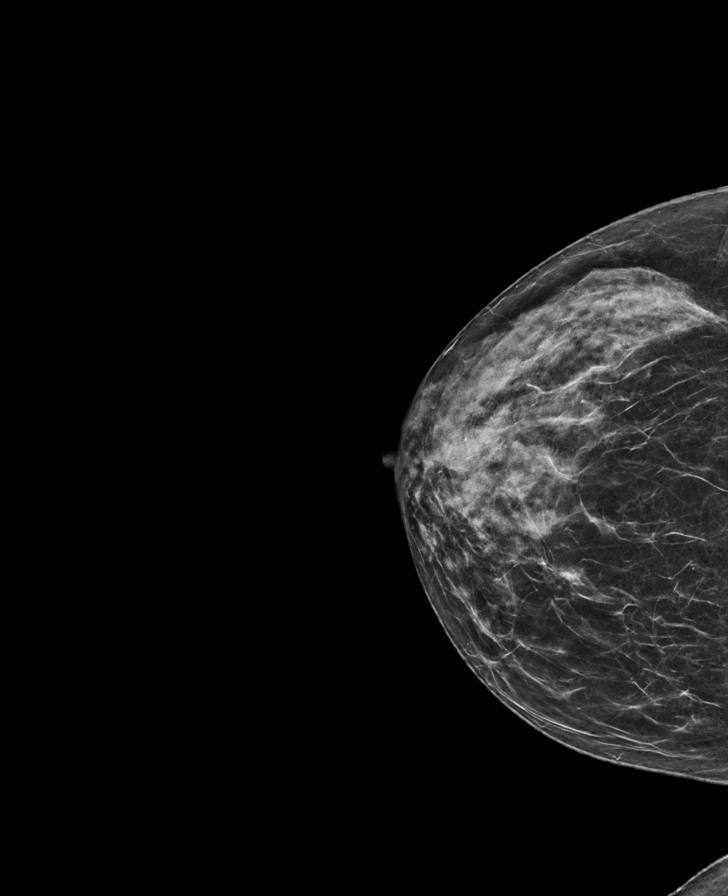

[R MLO synth-2D]
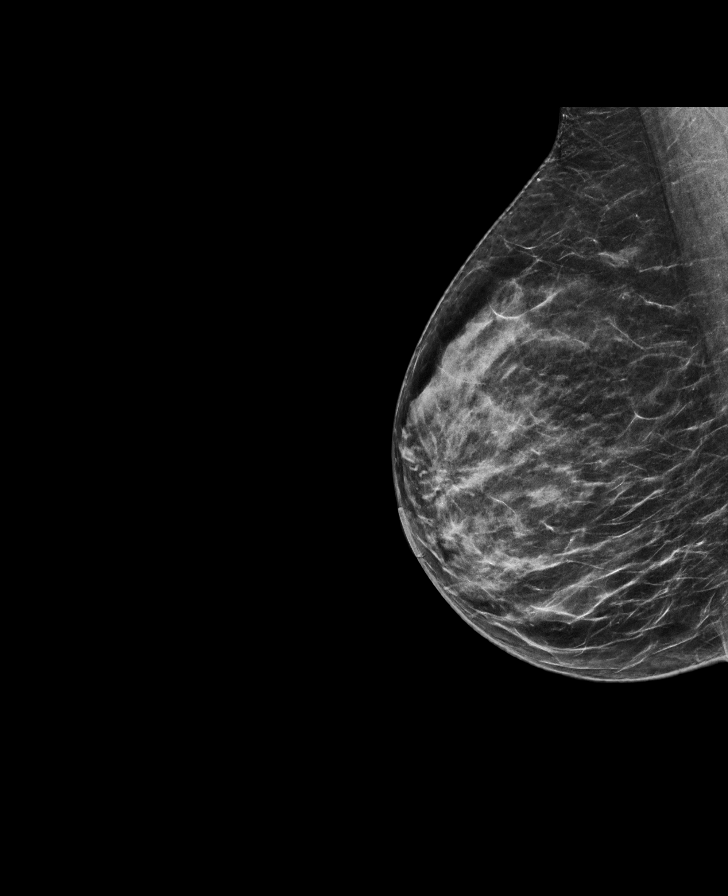

[L CC synth-2D]
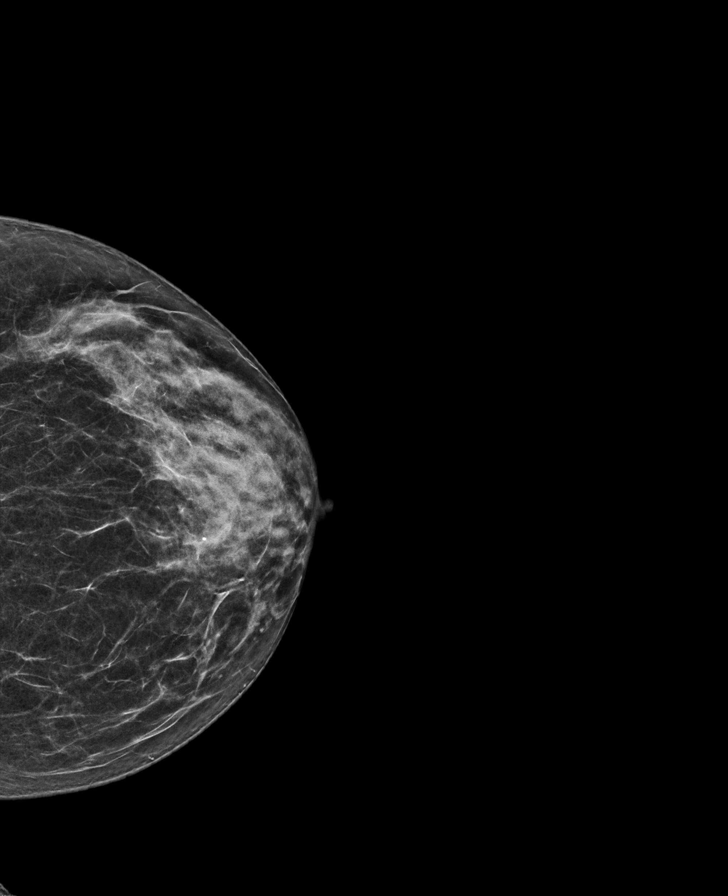

[L MLO tomo · tomo slice 33/66.0]
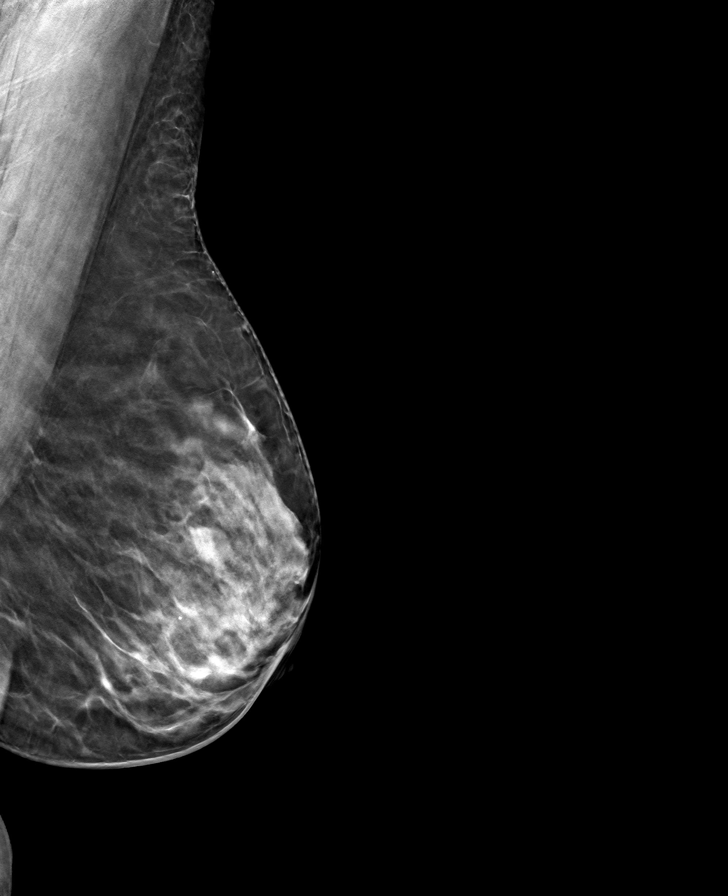

[R CC tomo · tomo slice 30/59.0]
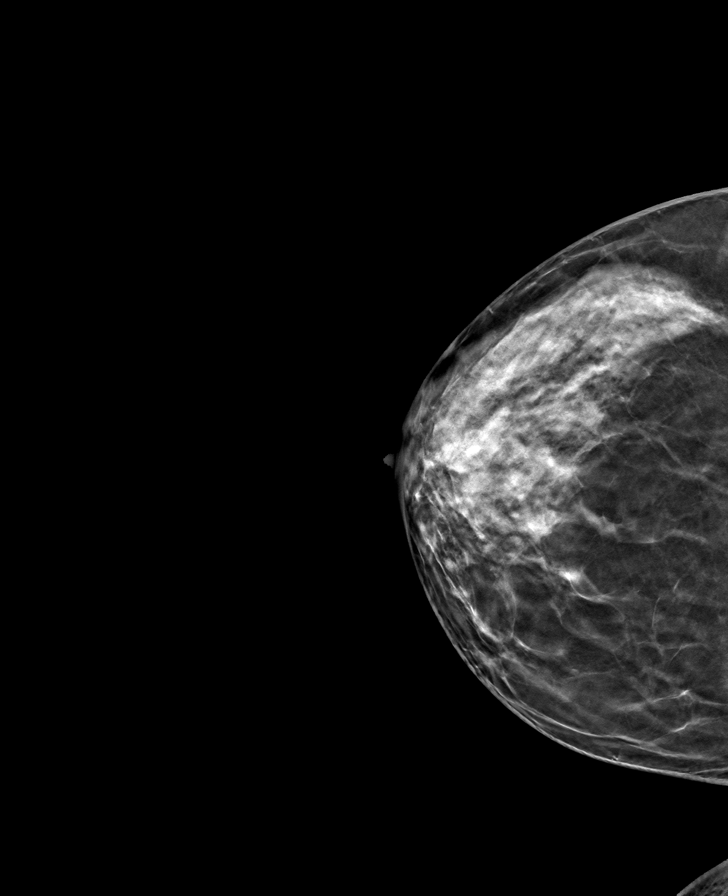

[L CC tomo · tomo slice 30/59.0]
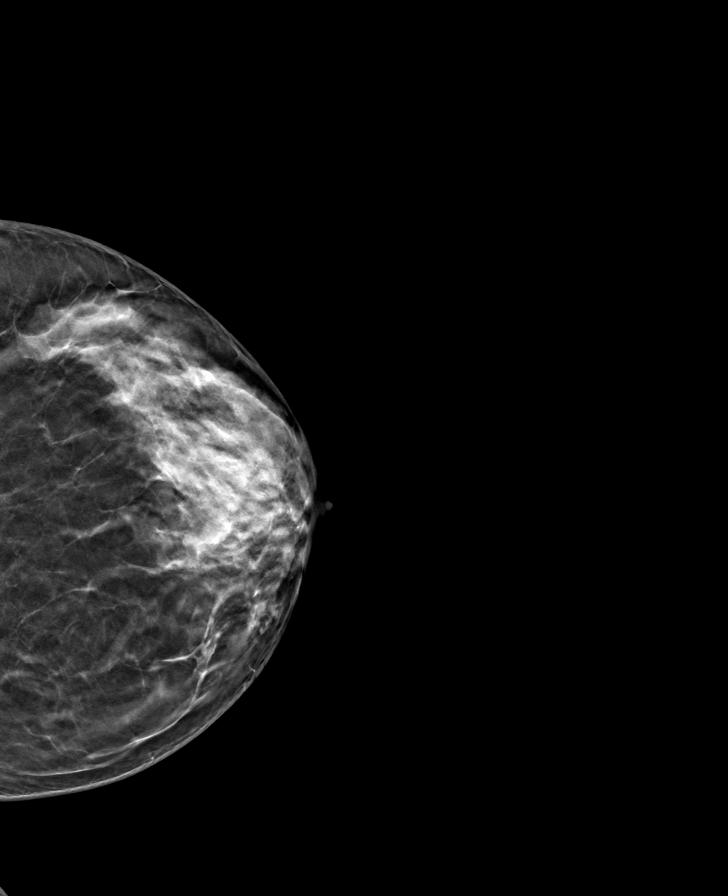

[R MLO tomo · tomo slice 33/64.0]
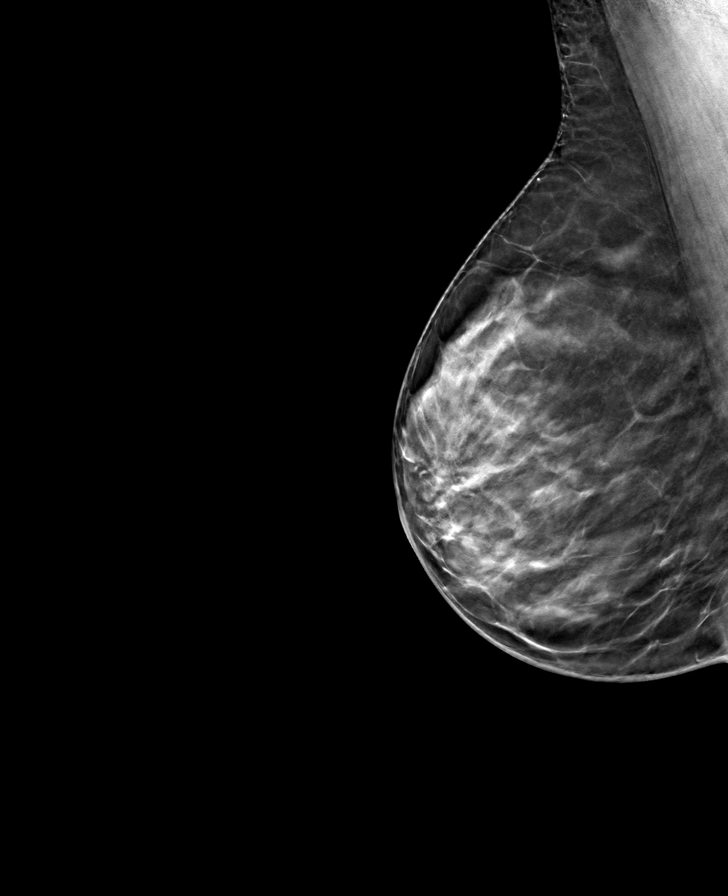

[8 of 24 positions shown; findings below may reference images not displayed]

ACR Breast Density Category c: The breast tissue is heterogeneously
dense, which may obscure small masses.
FINDINGS: There are no findings suspicious for malignancy. Images were
processed with CAD.
IMPRESSION: No mammographic evidence of malignancy. A result letter of this
screening mammogram will be mailed directly to the patient.

RECOMMENDATION:
Screening mammogram in one year. (Code:[id number]-LHB)

BI-RADS CATEGORY  1: Negative.

## 2021-11-08 ENCOUNTER — Other Ambulatory Visit: Payer: Self-pay | Admitting: Internal Medicine

## 2021-11-08 DIAGNOSIS — Z1231 Encounter for screening mammogram for malignant neoplasm of breast: Secondary | ICD-10-CM

## 2021-11-24 DIAGNOSIS — E785 Hyperlipidemia, unspecified: Secondary | ICD-10-CM | POA: Diagnosis not present

## 2021-11-24 DIAGNOSIS — I10 Essential (primary) hypertension: Secondary | ICD-10-CM | POA: Diagnosis not present

## 2021-11-24 DIAGNOSIS — Z1389 Encounter for screening for other disorder: Secondary | ICD-10-CM | POA: Diagnosis not present

## 2021-11-24 DIAGNOSIS — M8580 Other specified disorders of bone density and structure, unspecified site: Secondary | ICD-10-CM | POA: Diagnosis not present

## 2021-11-24 DIAGNOSIS — E1169 Type 2 diabetes mellitus with other specified complication: Secondary | ICD-10-CM | POA: Diagnosis not present

## 2021-11-24 DIAGNOSIS — Z0000 Encounter for general adult medical examination without abnormal findings: Secondary | ICD-10-CM | POA: Diagnosis not present

## 2021-11-24 DIAGNOSIS — E039 Hypothyroidism, unspecified: Secondary | ICD-10-CM | POA: Diagnosis not present

## 2021-12-10 ENCOUNTER — Ambulatory Visit: Payer: Medicare Other

## 2021-12-21 ENCOUNTER — Ambulatory Visit
Admission: RE | Admit: 2021-12-21 | Discharge: 2021-12-21 | Disposition: A | Discharge status: Home / Self Care | Payer: Medicare Other | Source: Ambulatory Visit | Attending: Internal Medicine | Admitting: Internal Medicine

## 2021-12-21 DIAGNOSIS — Z1231 Encounter for screening mammogram for malignant neoplasm of breast: Secondary | ICD-10-CM | POA: Diagnosis not present

## 2022-03-03 DIAGNOSIS — M8588 Other specified disorders of bone density and structure, other site: Secondary | ICD-10-CM | POA: Diagnosis not present

## 2022-03-03 DIAGNOSIS — I10 Essential (primary) hypertension: Secondary | ICD-10-CM | POA: Diagnosis not present

## 2022-03-03 DIAGNOSIS — E7800 Pure hypercholesterolemia, unspecified: Secondary | ICD-10-CM | POA: Diagnosis not present

## 2022-03-03 DIAGNOSIS — Z23 Encounter for immunization: Secondary | ICD-10-CM | POA: Diagnosis not present

## 2022-03-03 DIAGNOSIS — E039 Hypothyroidism, unspecified: Secondary | ICD-10-CM | POA: Diagnosis not present

## 2022-03-03 DIAGNOSIS — E1169 Type 2 diabetes mellitus with other specified complication: Secondary | ICD-10-CM | POA: Diagnosis not present

## 2022-07-30 DIAGNOSIS — S92514A Nondisplaced fracture of proximal phalanx of right lesser toe(s), initial encounter for closed fracture: Secondary | ICD-10-CM | POA: Diagnosis not present

## 2022-07-30 DIAGNOSIS — W228XXA Striking against or struck by other objects, initial encounter: Secondary | ICD-10-CM | POA: Diagnosis not present

## 2022-08-30 DIAGNOSIS — R1310 Dysphagia, unspecified: Secondary | ICD-10-CM | POA: Diagnosis not present

## 2022-08-30 DIAGNOSIS — M79604 Pain in right leg: Secondary | ICD-10-CM | POA: Diagnosis not present

## 2022-08-31 DIAGNOSIS — M79604 Pain in right leg: Secondary | ICD-10-CM | POA: Diagnosis not present

## 2022-09-02 DIAGNOSIS — H01023 Squamous blepharitis right eye, unspecified eyelid: Secondary | ICD-10-CM | POA: Diagnosis not present

## 2022-09-02 DIAGNOSIS — H01026 Squamous blepharitis left eye, unspecified eyelid: Secondary | ICD-10-CM | POA: Diagnosis not present

## 2022-09-02 DIAGNOSIS — H11823 Conjunctivochalasis, bilateral: Secondary | ICD-10-CM | POA: Diagnosis not present

## 2022-09-02 DIAGNOSIS — H401132 Primary open-angle glaucoma, bilateral, moderate stage: Secondary | ICD-10-CM | POA: Diagnosis not present

## 2022-09-02 DIAGNOSIS — E119 Type 2 diabetes mellitus without complications: Secondary | ICD-10-CM | POA: Diagnosis not present

## 2022-09-02 DIAGNOSIS — H47213 Primary optic atrophy, bilateral: Secondary | ICD-10-CM | POA: Diagnosis not present

## 2022-09-02 DIAGNOSIS — Z961 Presence of intraocular lens: Secondary | ICD-10-CM | POA: Diagnosis not present

## 2022-09-02 DIAGNOSIS — H43811 Vitreous degeneration, right eye: Secondary | ICD-10-CM | POA: Diagnosis not present

## 2022-09-12 DIAGNOSIS — R1310 Dysphagia, unspecified: Secondary | ICD-10-CM | POA: Diagnosis not present

## 2022-09-12 DIAGNOSIS — E119 Type 2 diabetes mellitus without complications: Secondary | ICD-10-CM | POA: Diagnosis not present

## 2022-09-13 ENCOUNTER — Other Ambulatory Visit: Payer: Self-pay | Admitting: Internal Medicine

## 2022-09-13 DIAGNOSIS — R1310 Dysphagia, unspecified: Secondary | ICD-10-CM

## 2022-09-28 ENCOUNTER — Other Ambulatory Visit (HOSPITAL_COMMUNITY): Payer: Self-pay | Admitting: Internal Medicine

## 2022-09-28 DIAGNOSIS — R1310 Dysphagia, unspecified: Secondary | ICD-10-CM

## 2022-10-04 ENCOUNTER — Other Ambulatory Visit (HOSPITAL_COMMUNITY): Payer: Self-pay | Admitting: Internal Medicine

## 2022-10-04 ENCOUNTER — Ambulatory Visit (HOSPITAL_COMMUNITY)
Admission: RE | Admit: 2022-10-04 | Discharge: 2022-10-04 | Disposition: A | Discharge status: Home / Self Care | Payer: Medicare Other | Source: Ambulatory Visit | Attending: Internal Medicine | Admitting: Internal Medicine

## 2022-10-04 DIAGNOSIS — R1310 Dysphagia, unspecified: Secondary | ICD-10-CM

## 2022-11-09 DIAGNOSIS — R933 Abnormal findings on diagnostic imaging of other parts of digestive tract: Secondary | ICD-10-CM | POA: Diagnosis not present

## 2022-11-09 DIAGNOSIS — R1314 Dysphagia, pharyngoesophageal phase: Secondary | ICD-10-CM | POA: Diagnosis not present

## 2022-11-17 DIAGNOSIS — K2930 Chronic superficial gastritis without bleeding: Secondary | ICD-10-CM | POA: Diagnosis not present

## 2022-11-17 DIAGNOSIS — B9681 Helicobacter pylori [H. pylori] as the cause of diseases classified elsewhere: Secondary | ICD-10-CM | POA: Diagnosis not present

## 2022-11-17 DIAGNOSIS — K2940 Chronic atrophic gastritis without bleeding: Secondary | ICD-10-CM | POA: Diagnosis not present

## 2022-11-17 DIAGNOSIS — K229 Disease of esophagus, unspecified: Secondary | ICD-10-CM | POA: Diagnosis not present

## 2022-11-17 DIAGNOSIS — R1310 Dysphagia, unspecified: Secondary | ICD-10-CM | POA: Diagnosis not present

## 2022-11-17 DIAGNOSIS — K222 Esophageal obstruction: Secondary | ICD-10-CM | POA: Diagnosis not present

## 2022-11-17 DIAGNOSIS — K2950 Unspecified chronic gastritis without bleeding: Secondary | ICD-10-CM | POA: Diagnosis not present

## 2022-11-17 DIAGNOSIS — K209 Esophagitis, unspecified without bleeding: Secondary | ICD-10-CM | POA: Diagnosis not present

## 2022-11-17 DIAGNOSIS — B3781 Candidal esophagitis: Secondary | ICD-10-CM | POA: Diagnosis not present

## 2022-11-17 DIAGNOSIS — R933 Abnormal findings on diagnostic imaging of other parts of digestive tract: Secondary | ICD-10-CM | POA: Diagnosis not present

## 2022-11-17 DIAGNOSIS — K31A19 Gastric intestinal metaplasia without dysplasia, unspecified site: Secondary | ICD-10-CM | POA: Diagnosis not present

## 2022-11-21 DIAGNOSIS — H401132 Primary open-angle glaucoma, bilateral, moderate stage: Secondary | ICD-10-CM | POA: Diagnosis not present

## 2022-11-24 DIAGNOSIS — B3781 Candidal esophagitis: Secondary | ICD-10-CM | POA: Diagnosis not present

## 2022-11-24 DIAGNOSIS — K2940 Chronic atrophic gastritis without bleeding: Secondary | ICD-10-CM | POA: Diagnosis not present

## 2022-11-24 DIAGNOSIS — B9681 Helicobacter pylori [H. pylori] as the cause of diseases classified elsewhere: Secondary | ICD-10-CM | POA: Diagnosis not present

## 2022-11-25 ENCOUNTER — Other Ambulatory Visit: Payer: Self-pay | Admitting: Internal Medicine

## 2022-11-25 DIAGNOSIS — Z1231 Encounter for screening mammogram for malignant neoplasm of breast: Secondary | ICD-10-CM

## 2022-11-28 DIAGNOSIS — Z0000 Encounter for general adult medical examination without abnormal findings: Secondary | ICD-10-CM | POA: Diagnosis not present

## 2022-11-28 DIAGNOSIS — H401132 Primary open-angle glaucoma, bilateral, moderate stage: Secondary | ICD-10-CM | POA: Diagnosis not present

## 2022-11-28 DIAGNOSIS — E1139 Type 2 diabetes mellitus with other diabetic ophthalmic complication: Secondary | ICD-10-CM | POA: Diagnosis not present

## 2022-11-28 DIAGNOSIS — I10 Essential (primary) hypertension: Secondary | ICD-10-CM | POA: Diagnosis not present

## 2022-11-28 DIAGNOSIS — M8580 Other specified disorders of bone density and structure, unspecified site: Secondary | ICD-10-CM | POA: Diagnosis not present

## 2022-11-28 DIAGNOSIS — E119 Type 2 diabetes mellitus without complications: Secondary | ICD-10-CM | POA: Diagnosis not present

## 2022-11-28 DIAGNOSIS — E039 Hypothyroidism, unspecified: Secondary | ICD-10-CM | POA: Diagnosis not present

## 2022-12-28 ENCOUNTER — Ambulatory Visit: Admission: RE | Admit: 2022-12-28 | Payer: Medicare Other | Source: Ambulatory Visit

## 2022-12-28 DIAGNOSIS — Z1231 Encounter for screening mammogram for malignant neoplasm of breast: Secondary | ICD-10-CM | POA: Diagnosis not present

## 2023-01-20 DIAGNOSIS — K229 Disease of esophagus, unspecified: Secondary | ICD-10-CM | POA: Diagnosis not present

## 2023-01-20 DIAGNOSIS — K31A19 Gastric intestinal metaplasia without dysplasia, unspecified site: Secondary | ICD-10-CM | POA: Diagnosis not present

## 2023-01-20 DIAGNOSIS — K2970 Gastritis, unspecified, without bleeding: Secondary | ICD-10-CM | POA: Diagnosis not present

## 2023-01-20 DIAGNOSIS — K2930 Chronic superficial gastritis without bleeding: Secondary | ICD-10-CM | POA: Diagnosis not present

## 2023-01-20 DIAGNOSIS — K2940 Chronic atrophic gastritis without bleeding: Secondary | ICD-10-CM | POA: Diagnosis not present

## 2023-01-20 DIAGNOSIS — B9681 Helicobacter pylori [H. pylori] as the cause of diseases classified elsewhere: Secondary | ICD-10-CM | POA: Diagnosis not present

## 2023-01-20 DIAGNOSIS — K222 Esophageal obstruction: Secondary | ICD-10-CM | POA: Diagnosis not present

## 2023-01-27 DIAGNOSIS — K2940 Chronic atrophic gastritis without bleeding: Secondary | ICD-10-CM | POA: Diagnosis not present

## 2023-01-27 DIAGNOSIS — B9681 Helicobacter pylori [H. pylori] as the cause of diseases classified elsewhere: Secondary | ICD-10-CM | POA: Diagnosis not present

## 2023-04-05 DIAGNOSIS — K31A19 Gastric intestinal metaplasia without dysplasia, unspecified site: Secondary | ICD-10-CM | POA: Diagnosis not present

## 2023-04-05 DIAGNOSIS — B9681 Helicobacter pylori [H. pylori] as the cause of diseases classified elsewhere: Secondary | ICD-10-CM | POA: Diagnosis not present

## 2023-04-05 DIAGNOSIS — K2930 Chronic superficial gastritis without bleeding: Secondary | ICD-10-CM | POA: Diagnosis not present

## 2023-04-05 DIAGNOSIS — R1310 Dysphagia, unspecified: Secondary | ICD-10-CM | POA: Diagnosis not present

## 2023-04-05 DIAGNOSIS — K2940 Chronic atrophic gastritis without bleeding: Secondary | ICD-10-CM | POA: Diagnosis not present

## 2023-04-05 DIAGNOSIS — K222 Esophageal obstruction: Secondary | ICD-10-CM | POA: Diagnosis not present

## 2023-04-11 DIAGNOSIS — H401132 Primary open-angle glaucoma, bilateral, moderate stage: Secondary | ICD-10-CM | POA: Diagnosis not present

## 2023-04-11 DIAGNOSIS — H26491 Other secondary cataract, right eye: Secondary | ICD-10-CM | POA: Diagnosis not present

## 2023-04-12 DIAGNOSIS — K2940 Chronic atrophic gastritis without bleeding: Secondary | ICD-10-CM | POA: Diagnosis not present

## 2023-04-12 DIAGNOSIS — B9681 Helicobacter pylori [H. pylori] as the cause of diseases classified elsewhere: Secondary | ICD-10-CM | POA: Diagnosis not present

## 2023-04-12 DIAGNOSIS — K2930 Chronic superficial gastritis without bleeding: Secondary | ICD-10-CM | POA: Diagnosis not present

## 2023-04-12 DIAGNOSIS — K31A19 Gastric intestinal metaplasia without dysplasia, unspecified site: Secondary | ICD-10-CM | POA: Diagnosis not present

## 2023-05-31 DIAGNOSIS — H9319 Tinnitus, unspecified ear: Secondary | ICD-10-CM | POA: Diagnosis not present

## 2023-05-31 DIAGNOSIS — M8580 Other specified disorders of bone density and structure, unspecified site: Secondary | ICD-10-CM | POA: Diagnosis not present

## 2023-05-31 DIAGNOSIS — E1139 Type 2 diabetes mellitus with other diabetic ophthalmic complication: Secondary | ICD-10-CM | POA: Diagnosis not present

## 2023-05-31 DIAGNOSIS — E7800 Pure hypercholesterolemia, unspecified: Secondary | ICD-10-CM | POA: Diagnosis not present

## 2023-05-31 DIAGNOSIS — I10 Essential (primary) hypertension: Secondary | ICD-10-CM | POA: Diagnosis not present

## 2023-05-31 DIAGNOSIS — E039 Hypothyroidism, unspecified: Secondary | ICD-10-CM | POA: Diagnosis not present

## 2023-06-29 DIAGNOSIS — M549 Dorsalgia, unspecified: Secondary | ICD-10-CM | POA: Diagnosis not present

## 2023-06-29 DIAGNOSIS — R8290 Unspecified abnormal findings in urine: Secondary | ICD-10-CM | POA: Diagnosis not present

## 2023-07-11 DIAGNOSIS — R8290 Unspecified abnormal findings in urine: Secondary | ICD-10-CM | POA: Diagnosis not present

## 2023-08-21 DIAGNOSIS — H401132 Primary open-angle glaucoma, bilateral, moderate stage: Secondary | ICD-10-CM | POA: Diagnosis not present

## 2023-08-21 DIAGNOSIS — H11823 Conjunctivochalasis, bilateral: Secondary | ICD-10-CM | POA: Diagnosis not present

## 2023-08-21 DIAGNOSIS — H47213 Primary optic atrophy, bilateral: Secondary | ICD-10-CM | POA: Diagnosis not present

## 2023-08-21 DIAGNOSIS — H26491 Other secondary cataract, right eye: Secondary | ICD-10-CM | POA: Diagnosis not present

## 2023-08-21 DIAGNOSIS — Z961 Presence of intraocular lens: Secondary | ICD-10-CM | POA: Diagnosis not present

## 2023-08-21 DIAGNOSIS — H43811 Vitreous degeneration, right eye: Secondary | ICD-10-CM | POA: Diagnosis not present

## 2023-11-14 DIAGNOSIS — Z09 Encounter for follow-up examination after completed treatment for conditions other than malignant neoplasm: Secondary | ICD-10-CM | POA: Diagnosis not present

## 2023-11-14 DIAGNOSIS — Z860101 Personal history of adenomatous and serrated colon polyps: Secondary | ICD-10-CM | POA: Diagnosis not present

## 2023-11-24 ENCOUNTER — Other Ambulatory Visit: Payer: Self-pay | Admitting: Internal Medicine

## 2023-11-24 DIAGNOSIS — Z1231 Encounter for screening mammogram for malignant neoplasm of breast: Secondary | ICD-10-CM

## 2023-11-30 DIAGNOSIS — Z1331 Encounter for screening for depression: Secondary | ICD-10-CM | POA: Diagnosis not present

## 2023-11-30 DIAGNOSIS — H9319 Tinnitus, unspecified ear: Secondary | ICD-10-CM | POA: Diagnosis not present

## 2023-11-30 DIAGNOSIS — H401132 Primary open-angle glaucoma, bilateral, moderate stage: Secondary | ICD-10-CM | POA: Diagnosis not present

## 2023-11-30 DIAGNOSIS — E039 Hypothyroidism, unspecified: Secondary | ICD-10-CM | POA: Diagnosis not present

## 2023-11-30 DIAGNOSIS — E7800 Pure hypercholesterolemia, unspecified: Secondary | ICD-10-CM | POA: Diagnosis not present

## 2023-11-30 DIAGNOSIS — Z0000 Encounter for general adult medical examination without abnormal findings: Secondary | ICD-10-CM | POA: Diagnosis not present

## 2023-11-30 DIAGNOSIS — I10 Essential (primary) hypertension: Secondary | ICD-10-CM | POA: Diagnosis not present

## 2023-11-30 DIAGNOSIS — E1139 Type 2 diabetes mellitus with other diabetic ophthalmic complication: Secondary | ICD-10-CM | POA: Diagnosis not present

## 2023-11-30 DIAGNOSIS — M8580 Other specified disorders of bone density and structure, unspecified site: Secondary | ICD-10-CM | POA: Diagnosis not present

## 2023-11-30 DIAGNOSIS — E1169 Type 2 diabetes mellitus with other specified complication: Secondary | ICD-10-CM | POA: Diagnosis not present

## 2023-11-30 DIAGNOSIS — Z23 Encounter for immunization: Secondary | ICD-10-CM | POA: Diagnosis not present

## 2023-12-19 DIAGNOSIS — H01023 Squamous blepharitis right eye, unspecified eyelid: Secondary | ICD-10-CM | POA: Diagnosis not present

## 2023-12-19 DIAGNOSIS — H401132 Primary open-angle glaucoma, bilateral, moderate stage: Secondary | ICD-10-CM | POA: Diagnosis not present

## 2023-12-19 DIAGNOSIS — H11823 Conjunctivochalasis, bilateral: Secondary | ICD-10-CM | POA: Diagnosis not present

## 2023-12-19 DIAGNOSIS — H43811 Vitreous degeneration, right eye: Secondary | ICD-10-CM | POA: Diagnosis not present

## 2023-12-19 DIAGNOSIS — H01026 Squamous blepharitis left eye, unspecified eyelid: Secondary | ICD-10-CM | POA: Diagnosis not present

## 2023-12-19 DIAGNOSIS — H47213 Primary optic atrophy, bilateral: Secondary | ICD-10-CM | POA: Diagnosis not present

## 2023-12-29 ENCOUNTER — Ambulatory Visit
Admission: RE | Admit: 2023-12-29 | Discharge: 2023-12-29 | Disposition: A | Discharge status: Home / Self Care | Source: Ambulatory Visit | Attending: Internal Medicine | Admitting: Internal Medicine

## 2023-12-29 DIAGNOSIS — Z1231 Encounter for screening mammogram for malignant neoplasm of breast: Secondary | ICD-10-CM | POA: Diagnosis not present

## 2023-12-31 DIAGNOSIS — E039 Hypothyroidism, unspecified: Secondary | ICD-10-CM | POA: Diagnosis not present

## 2023-12-31 DIAGNOSIS — H401132 Primary open-angle glaucoma, bilateral, moderate stage: Secondary | ICD-10-CM | POA: Diagnosis not present

## 2023-12-31 DIAGNOSIS — E1169 Type 2 diabetes mellitus with other specified complication: Secondary | ICD-10-CM | POA: Diagnosis not present

## 2023-12-31 DIAGNOSIS — E1139 Type 2 diabetes mellitus with other diabetic ophthalmic complication: Secondary | ICD-10-CM | POA: Diagnosis not present

## 2024-01-30 DIAGNOSIS — E1169 Type 2 diabetes mellitus with other specified complication: Secondary | ICD-10-CM | POA: Diagnosis not present

## 2024-01-30 DIAGNOSIS — E039 Hypothyroidism, unspecified: Secondary | ICD-10-CM | POA: Diagnosis not present

## 2024-01-30 DIAGNOSIS — E1139 Type 2 diabetes mellitus with other diabetic ophthalmic complication: Secondary | ICD-10-CM | POA: Diagnosis not present

## 2024-01-30 DIAGNOSIS — H401132 Primary open-angle glaucoma, bilateral, moderate stage: Secondary | ICD-10-CM | POA: Diagnosis not present

## 2024-02-28 DIAGNOSIS — E785 Hyperlipidemia, unspecified: Secondary | ICD-10-CM | POA: Diagnosis not present

## 2024-02-28 DIAGNOSIS — E039 Hypothyroidism, unspecified: Secondary | ICD-10-CM | POA: Diagnosis not present

## 2024-02-28 DIAGNOSIS — Z23 Encounter for immunization: Secondary | ICD-10-CM | POA: Diagnosis not present

## 2024-02-28 DIAGNOSIS — E1139 Type 2 diabetes mellitus with other diabetic ophthalmic complication: Secondary | ICD-10-CM | POA: Diagnosis not present

## 2024-02-28 DIAGNOSIS — I10 Essential (primary) hypertension: Secondary | ICD-10-CM | POA: Diagnosis not present

## 2024-02-28 DIAGNOSIS — M8580 Other specified disorders of bone density and structure, unspecified site: Secondary | ICD-10-CM | POA: Diagnosis not present

## 2024-03-01 DIAGNOSIS — H401132 Primary open-angle glaucoma, bilateral, moderate stage: Secondary | ICD-10-CM | POA: Diagnosis not present

## 2024-03-01 DIAGNOSIS — E1169 Type 2 diabetes mellitus with other specified complication: Secondary | ICD-10-CM | POA: Diagnosis not present

## 2024-03-01 DIAGNOSIS — E039 Hypothyroidism, unspecified: Secondary | ICD-10-CM | POA: Diagnosis not present

## 2024-03-01 DIAGNOSIS — E1139 Type 2 diabetes mellitus with other diabetic ophthalmic complication: Secondary | ICD-10-CM | POA: Diagnosis not present

## 2024-03-31 DIAGNOSIS — H401132 Primary open-angle glaucoma, bilateral, moderate stage: Secondary | ICD-10-CM | POA: Diagnosis not present

## 2024-03-31 DIAGNOSIS — E1139 Type 2 diabetes mellitus with other diabetic ophthalmic complication: Secondary | ICD-10-CM | POA: Diagnosis not present

## 2024-03-31 DIAGNOSIS — E039 Hypothyroidism, unspecified: Secondary | ICD-10-CM | POA: Diagnosis not present

## 2024-03-31 DIAGNOSIS — E1169 Type 2 diabetes mellitus with other specified complication: Secondary | ICD-10-CM | POA: Diagnosis not present
